# Patient Record
Sex: Female | Born: 2000 | Race: White | Hispanic: No | Marital: Single | Smoking: Never smoker
Health system: Southern US, Community
[De-identification: ages and names within clinical notes are randomized; demographics above are authoritative.]

---
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
## 2022-07-23 ENCOUNTER — Ambulatory Visit
Admission: EM | Admit: 2022-07-23 | Discharge: 2022-07-23 | Disposition: A | Discharge status: Home / Self Care | Payer: 59

## 2022-07-23 DIAGNOSIS — M79645 Pain in left finger(s): Secondary | ICD-10-CM

## 2022-07-23 MED ORDER — PREDNISONE 20 MG PO TABS: 40.0000 mg | ORAL_TABLET | Freq: Every day | ORAL | 0 refills | Status: AC

## 2022-07-23 NOTE — ED Triage Notes (Signed)
The patient states she began having pain to her  left thumb last night. The patient states she does not know the cause.  Home interventions: none

## 2022-07-23 NOTE — ED Provider Notes (Signed)
[facility]    CSN: [id number] Arrival date & time: 07/23/22  1121      History   Chief Complaint Chief Complaint  Patient presents with   Finger Injury    HPI Rita Garcia is a 21 y.o. female.   Patient here today for left-sided thumb pain.  She reports that she first noticed pain last night.  She denies any known injury.  She does not play sports but does work out.  She reports that pain is mainly present with movement of her thumb and typically she feels pain to her left first MCP joint.  She reports some pain in this area with movement of her wrist as well but denies wrist pain.  She denies any numbness or tingling.  She has not taken any medication for symptoms.  The history is provided by the patient.    History reviewed. No pertinent past medical history.  Patient Active Problem List   Diagnosis Date Noted   Left wrist injury 04/12/2012    History reviewed. No pertinent surgical history.  OB History   No obstetric history on file.      Home Medications    Prior to Admission medications   Medication Sig Start Date End Date Taking? Authorizing Provider  escitalopram (LEXAPRO) 10 MG tablet Take 10 mg by mouth daily. 05/01/22  Yes [provider]  predniSONE (DELTASONE) 20 MG tablet Take 2 tablets (40 mg total) by mouth daily with breakfast for 5 days. 07/23/22 07/28/22 Yes Francene Finders, PA-C  traZODone (DESYREL) 50 MG tablet Take 25-50 mg by mouth at bedtime. 05/16/22  Yes [provider]    Family History Family History  Problem Relation Age of Onset   Hypertension Father    Diabetes Neg Hx    Heart attack Neg Hx    Hyperlipidemia Neg Hx    Sudden death Neg Hx     Social History Social History   Tobacco Use   Smoking status: Never  Vaping Use   Vaping Use: Some days  Substance Use Topics   Alcohol use: Never   Drug use: No     Allergies   Patient has no known allergies.   Review of Systems Review of  Systems  Constitutional:  Negative for chills and fever.  Eyes:  Negative for discharge and redness.  Respiratory:  Negative for shortness of breath.   Musculoskeletal:  Positive for arthralgias. Negative for joint swelling.  Skin:  Negative for color change and wound.  Neurological:  Negative for numbness.     Physical Exam Triage Vital Signs ED Triage Vitals  Enc Vitals Group     BP      Pulse      Resp      Temp      Temp src      SpO2      Weight      Height      Head Circumference      Peak Flow      Pain Score      Pain Loc      Pain Edu?      Excl. in [place]?    No data found.  Updated Vital Signs BP (!) 94/57 (BP Location: Right Arm)   Pulse (!) 54   Temp 98 F (36.7 C) (Oral)   Resp 16   LMP  (LMP Unknown)   SpO2 95%      Physical Exam Vitals and nursing note reviewed.  Constitutional:      General: She is not in acute distress.    Appearance: Normal appearance. She is not ill-appearing.  HENT:     Head: Normocephalic and atraumatic.  Eyes:     Conjunctiva/sclera: Conjunctivae normal.  Cardiovascular:     Rate and Rhythm: Normal rate.  Pulmonary:     Effort: Pulmonary effort is normal. No respiratory distress.  Musculoskeletal:     Comments: Full ROM of left thumb, no apparent swelling.   Skin:    General: Skin is warm and dry.     Capillary Refill: Normal cap refill to left thumb Neurological:     Mental Status: She is alert.     Comments: Gross sensation intact to distal left thumb  Psychiatric:        Mood and Affect: Mood normal.        Behavior: Behavior normal.        Thought Content: Thought content normal.      UC Treatments / Results  Labs (all labs ordered are listed, but only abnormal results are displayed) Labs Reviewed - No data to display  EKG   Radiology No results found.  Procedures Procedures (including critical care time)  Medications Ordered in UC Medications - No data to display  Initial Impression /  Assessment and Plan / UC Course  I have reviewed the triage vital signs and the nursing notes.  Pertinent labs & imaging results that were available during my care of the patient were reviewed by me and considered in my medical decision making (see chart for details).    Suspect likely strain/ sprain- will treat with steroid burst and recommend follow up with ortho if no gradual improvement or with any further concerns.   Final Clinical Impressions(s) / UC Diagnoses   Final diagnoses:  Thumb pain, left   Discharge Instructions   None    ED Prescriptions     Medication Sig Dispense Auth. Provider   predniSONE (DELTASONE) 20 MG tablet Take 2 tablets (40 mg total) by mouth daily with breakfast for 5 days. 10 tablet Tomi Bamberger, PA-C      PDMP not reviewed this encounter.   Tomi Bamberger, PA-C 07/23/22 1222
